# Patient Record
Sex: Female | Born: 2000 | Race: White | Hispanic: No | Smoking: Never smoker
Health system: Southern US, Community
[De-identification: ages and names within clinical notes are randomized; demographics above are authoritative.]

## PROBLEM LIST (undated history)

## (undated) DIAGNOSIS — E7800 Pure hypercholesterolemia, unspecified: Secondary | ICD-10-CM

## (undated) DIAGNOSIS — N83209 Unspecified ovarian cyst, unspecified side: Secondary | ICD-10-CM

## (undated) DIAGNOSIS — F909 Attention-deficit hyperactivity disorder, unspecified type: Secondary | ICD-10-CM

## (undated) DIAGNOSIS — F32A Depression, unspecified: Secondary | ICD-10-CM

## (undated) DIAGNOSIS — K5900 Constipation, unspecified: Secondary | ICD-10-CM

## (undated) DIAGNOSIS — F329 Major depressive disorder, single episode, unspecified: Secondary | ICD-10-CM

## (undated) DIAGNOSIS — R519 Headache, unspecified: Secondary | ICD-10-CM

## (undated) HISTORY — DX: Headache, unspecified: R51.9

## (undated) HISTORY — PX: TONSILECTOMY, ADENOIDECTOMY, BILATERAL MYRINGOTOMY AND TUBES: SHX2538

## (undated) HISTORY — PX: TONSILLECTOMY AND ADENOIDECTOMY: SUR1326

## (undated) HISTORY — PX: WISDOM TOOTH EXTRACTION: SHX21

## (undated) HISTORY — DX: Attention-deficit hyperactivity disorder, unspecified type: F90.9

## (undated) HISTORY — DX: Unspecified ovarian cyst, unspecified side: N83.209

## (undated) HISTORY — DX: Pure hypercholesterolemia, unspecified: E78.00

---
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
## 2019-02-17 ENCOUNTER — Other Ambulatory Visit: Payer: Self-pay

## 2019-02-17 DIAGNOSIS — R6889 Other general symptoms and signs: Secondary | ICD-10-CM | POA: Diagnosis not present

## 2019-02-17 DIAGNOSIS — Z20822 Contact with and (suspected) exposure to covid-19: Secondary | ICD-10-CM

## 2019-02-18 LAB — NOVEL CORONAVIRUS, NAA: SARS-CoV-2, NAA: NOT DETECTED

## 2019-08-10 ENCOUNTER — Telehealth: Payer: Self-pay | Admitting: Advanced Practice Midwife

## 2019-08-10 NOTE — Telephone Encounter (Signed)
Tried to reach the patient to remind her of her appointment/restrictions, call can not be completed at this time. °

## 2019-08-11 ENCOUNTER — Ambulatory Visit: Payer: Medicaid Other | Admitting: Advanced Practice Midwife

## 2019-09-28 ENCOUNTER — Telehealth: Payer: Self-pay | Admitting: Advanced Practice Midwife

## 2019-09-28 NOTE — Telephone Encounter (Signed)
Tried to reach the patient to remind her of her appointment/restrictions, mailbox is not set up. 

## 2019-10-03 ENCOUNTER — Ambulatory Visit: Payer: Medicaid Other | Admitting: Advanced Practice Midwife

## 2019-11-12 DIAGNOSIS — Z23 Encounter for immunization: Secondary | ICD-10-CM | POA: Diagnosis not present

## 2019-11-29 DIAGNOSIS — R1933 Right lower quadrant abdominal rigidity: Secondary | ICD-10-CM | POA: Diagnosis not present

## 2019-12-05 DIAGNOSIS — R1013 Epigastric pain: Secondary | ICD-10-CM | POA: Diagnosis not present

## 2019-12-05 DIAGNOSIS — R112 Nausea with vomiting, unspecified: Secondary | ICD-10-CM | POA: Diagnosis not present

## 2019-12-05 DIAGNOSIS — R197 Diarrhea, unspecified: Secondary | ICD-10-CM | POA: Diagnosis not present

## 2019-12-05 DIAGNOSIS — R109 Unspecified abdominal pain: Secondary | ICD-10-CM | POA: Diagnosis not present

## 2019-12-05 DIAGNOSIS — R1031 Right lower quadrant pain: Secondary | ICD-10-CM | POA: Diagnosis not present

## 2019-12-05 DIAGNOSIS — R1011 Right upper quadrant pain: Secondary | ICD-10-CM | POA: Diagnosis not present

## 2019-12-12 DIAGNOSIS — Z23 Encounter for immunization: Secondary | ICD-10-CM | POA: Diagnosis not present

## 2020-02-09 DIAGNOSIS — N946 Dysmenorrhea, unspecified: Secondary | ICD-10-CM | POA: Diagnosis not present

## 2020-02-09 DIAGNOSIS — F3341 Major depressive disorder, recurrent, in partial remission: Secondary | ICD-10-CM | POA: Diagnosis not present

## 2020-04-02 DIAGNOSIS — Z6823 Body mass index (BMI) 23.0-23.9, adult: Secondary | ICD-10-CM | POA: Diagnosis not present

## 2020-04-02 DIAGNOSIS — Z0000 Encounter for general adult medical examination without abnormal findings: Secondary | ICD-10-CM | POA: Diagnosis not present

## 2020-07-03 DIAGNOSIS — N3091 Cystitis, unspecified with hematuria: Secondary | ICD-10-CM | POA: Diagnosis not present

## 2020-07-03 DIAGNOSIS — F3341 Major depressive disorder, recurrent, in partial remission: Secondary | ICD-10-CM | POA: Diagnosis not present

## 2020-07-03 DIAGNOSIS — Z6824 Body mass index (BMI) 24.0-24.9, adult: Secondary | ICD-10-CM | POA: Diagnosis not present

## 2020-07-08 DIAGNOSIS — H5213 Myopia, bilateral: Secondary | ICD-10-CM | POA: Diagnosis not present

## 2020-09-19 DIAGNOSIS — Z6826 Body mass index (BMI) 26.0-26.9, adult: Secondary | ICD-10-CM | POA: Diagnosis not present

## 2020-09-19 DIAGNOSIS — N946 Dysmenorrhea, unspecified: Secondary | ICD-10-CM | POA: Diagnosis not present

## 2020-09-19 DIAGNOSIS — F3341 Major depressive disorder, recurrent, in partial remission: Secondary | ICD-10-CM | POA: Diagnosis not present

## 2020-09-24 IMAGING — CT CT ABD-PELV W/ CM
2 of 4 series · 16 of 46 positions shown, 18 images · IV contrast (Isovue)
Comparison: CT abdomen and pelvis 09/14/2015.

CLINICAL DATA: Right flank pain and decreased appetite. Mildly
elevated white blood cell count.

EXAM:
CT ABDOMEN AND PELVIS WITH CONTRAST
TECHNIQUE: Multidetector CT imaging of the abdomen and pelvis was performed
using the standard protocol following bolus administration of
intravenous contrast.
CONTRAST:  80 mL 7GJDMM-JTT IOPAMIDOL (7GJDMM-JTT) INJECTION 61%

[Series 2: axial st · axial · 0.61mm/px · z∈[+1033,+1413]mm · 13 of 84 slices shown, 15 images]
[im 4/84  soft-tissue]
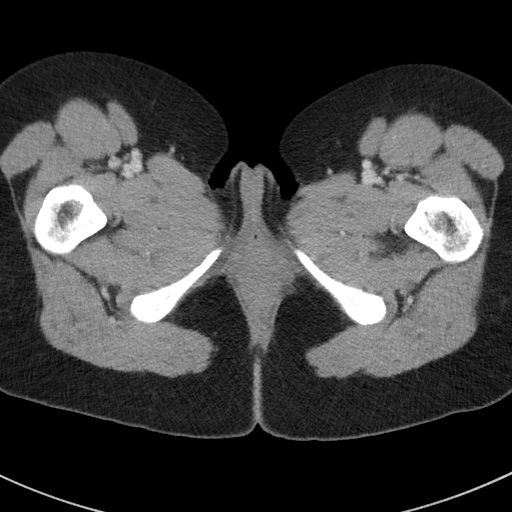
[im 4/84  bone]
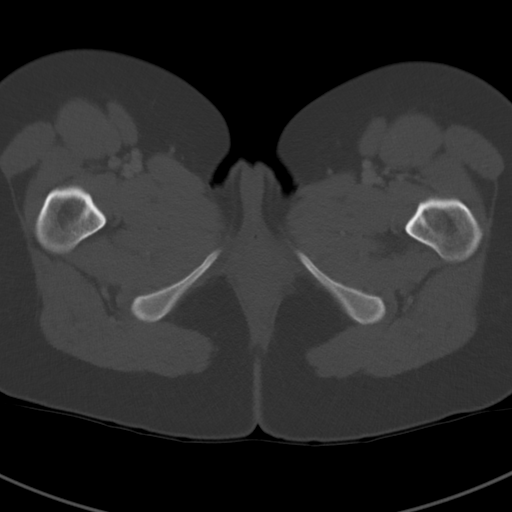
[im 10/84  soft-tissue]
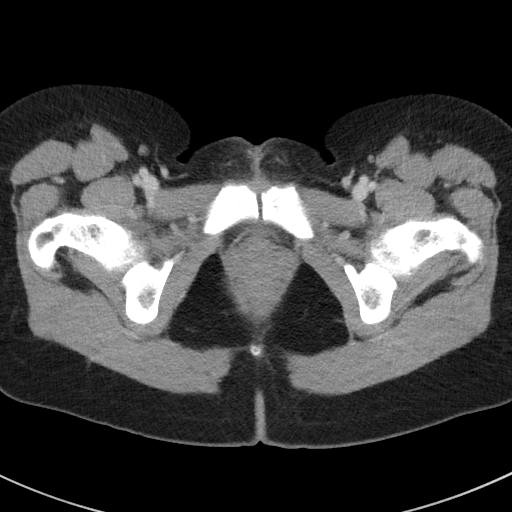
[im 17/84  soft-tissue]
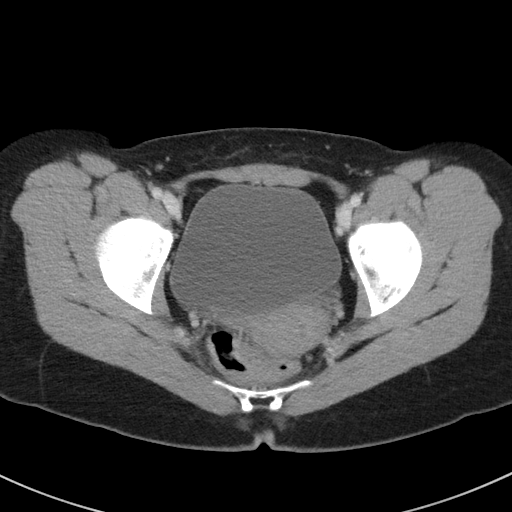
[im 24/84  soft-tissue]
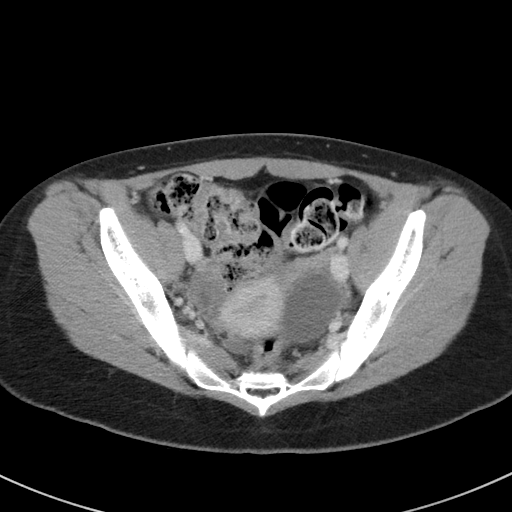
[im 30/84  soft-tissue]
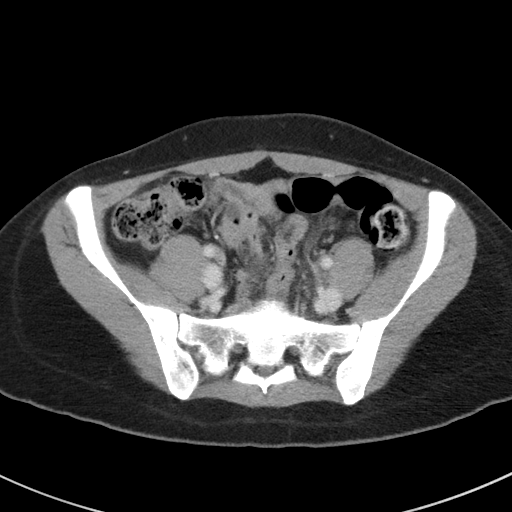
[im 37/84  soft-tissue]
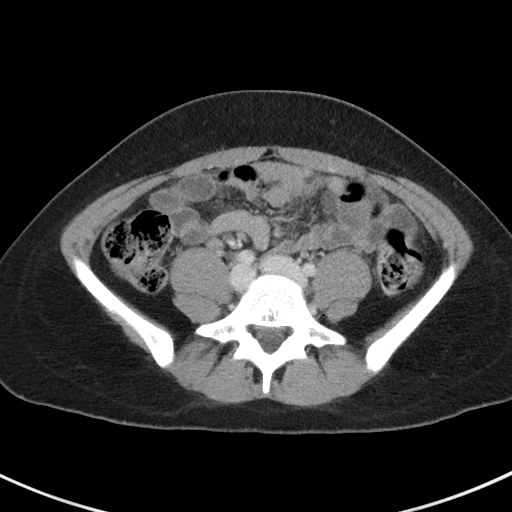
[im 44/84  soft-tissue]
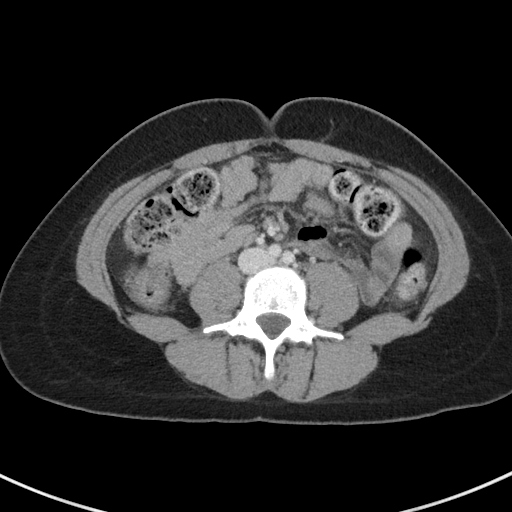
[im 47/84  soft-tissue]
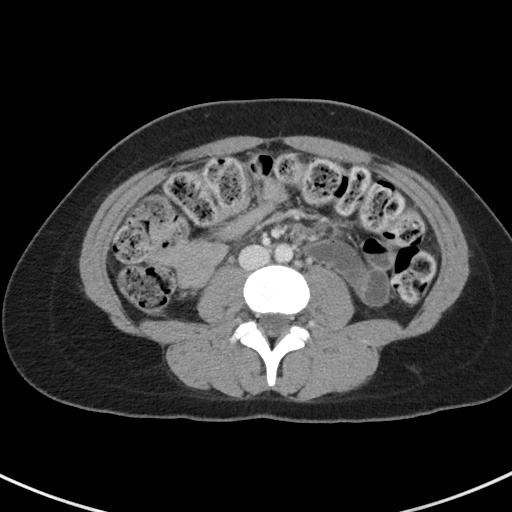
[im 54/84  soft-tissue]
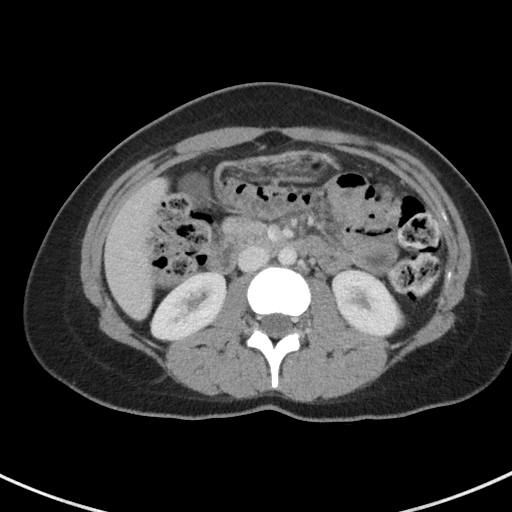
[im 54/84  bone]
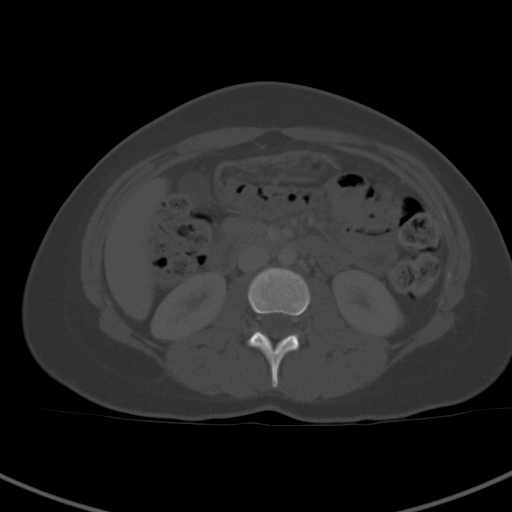
[im 60/84  soft-tissue]
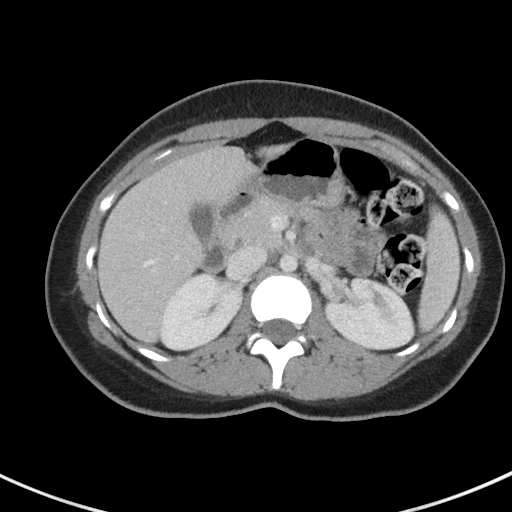
[im 67/84  soft-tissue]
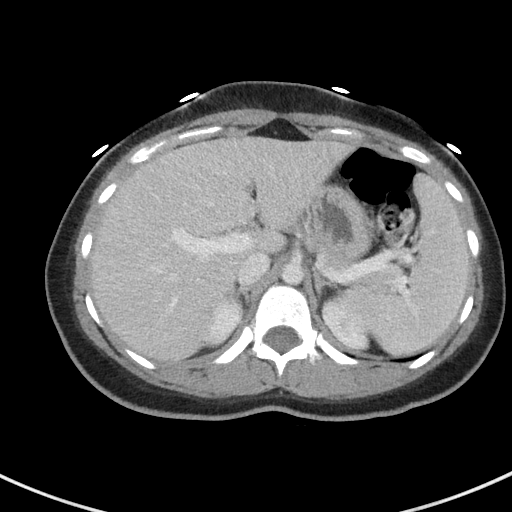
[im 74/84  soft-tissue]
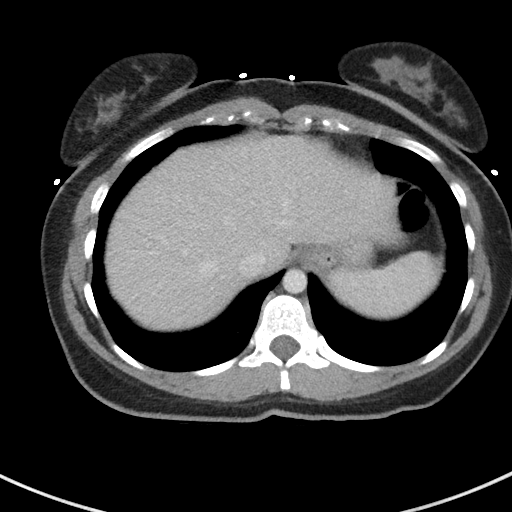
[im 80/84  soft-tissue]
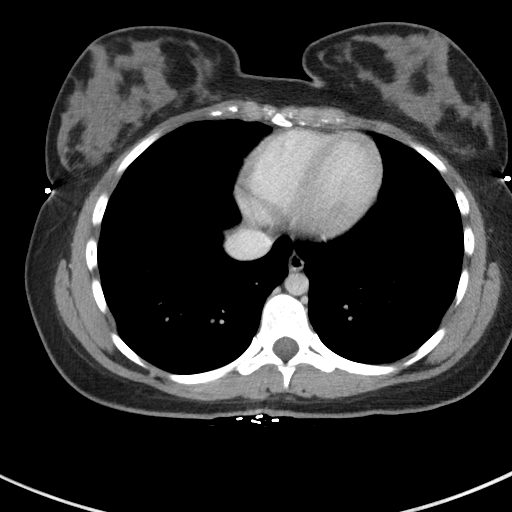

[Series 5: coronal st · coronal · 0.73mm/px · 3 of 72 slices shown]
[im 24/72  soft-tissue]
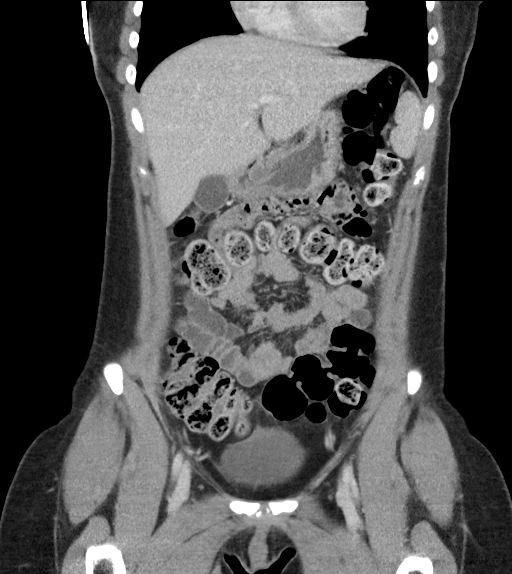
[im 32/72  soft-tissue]
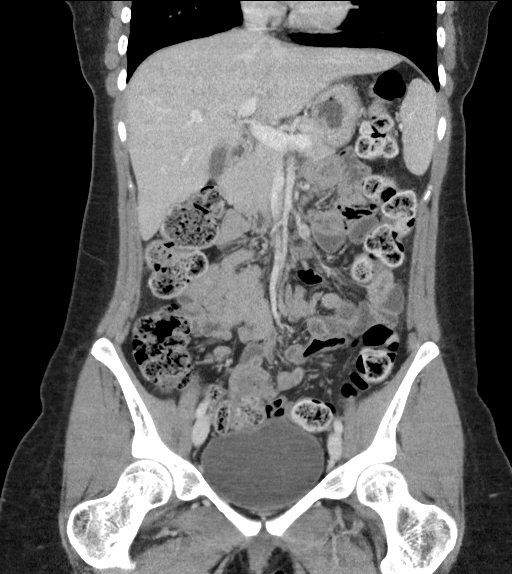
[im 40/72  soft-tissue]
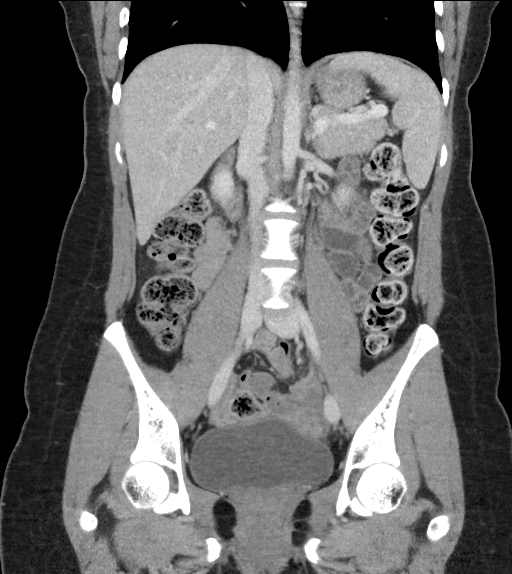

[16 of 46 positions shown; findings below may reference images not displayed]

FINDINGS: Lower chest: Lung bases clear.  No pleural or pericardial effusion.

Hepatobiliary: No focal liver abnormality is seen. No gallstones,
gallbladder wall thickening, or biliary dilatation.

Pancreas: Unremarkable. No pancreatic ductal dilatation or
surrounding inflammatory changes.

Spleen: Normal in size without focal abnormality.

Adrenals/Urinary Tract: Adrenal glands are unremarkable. Kidneys are
normal, without renal calculi, focal lesion, or hydronephrosis.
Bladder is unremarkable.

Stomach/Bowel: Stomach is within normal limits. Appendix appears
normal. No evidence of bowel wall thickening, distention, or
inflammatory changes.

Vascular/Lymphatic: No significant vascular findings are present. No
enlarged abdominal or pelvic lymph nodes.

Reproductive: Left ovarian cyst measuring 4.3 x 3.0 x 3.8 cm is
identified. Uterus and right ovary appear normal.

Other: None.

Musculoskeletal: Negative.
IMPRESSION: No acute abnormality abdomen or pelvis. No finding to explain the
patient's symptoms.

4.3 cm left ovarian cyst is probably benign. Follow-up ultrasound
6-12 weeks is recommended to ensure resolution. This recommendation
follows ACR consensus guidelines: White Paper of the ACR Incidental
Findings Committee II on Adnexal Findings. [HOSPITAL]

## 2020-10-06 NOTE — Progress Notes (Deleted)
   GYN VISIT Patient name: Jasmine Vega MRN [id number]  Date of birth: Jun 16, 2001 Chief Complaint:   No chief complaint on file.  History of Present Illness:   Jasmine Vega is a 20 y.o. G80P0000 *** female being seen today for ***.     -Records reviewed seen 09/19/20- reported AUB while on OCPs -Previously seen for ovarian cyst- resolved.  Previously on megace 40mg  daily- last seen 08/2018.    No LMP recorded.  Depression screen PHQ 2/9 08/17/2018  Decreased Interest 1  Down, Depressed, Hopeless 2  PHQ - 2 Score 3  Altered sleeping 1  Tired, decreased energy 1  Change in appetite 1  Feeling bad or failure about yourself  1  Trouble concentrating 1  Moving slowly or fidgety/restless 0  Suicidal thoughts 0  PHQ-9 Score 8  Difficult doing work/chores Very difficult     Review of Systems:   Pertinent items are noted in HPI Denies fever/chills, dizziness, headaches, visual disturbances, fatigue, shortness of breath, chest pain, abdominal pain, vomiting, *** problems with periods, bowel movements, urination, or intercourse unless otherwise stated above.  Pertinent History Reviewed:  Reviewed past medical,surgical, social, obstetrical and family history.  Reviewed problem list, medications and allergies. Physical Assessment:  There were no vitals filed for this visit.There is no height or weight on file to calculate BMI.       Physical Examination:   General appearance: alert, well appearing, and in no distress  Psych: mood appropriate, normal affect  Skin: warm & dry   Cardiovascular: normal heart rate noted  Respiratory: normal respiratory effort, no distress  Abdomen: soft, non-tender   Pelvic: {pelvic exam:315900::"normal external genitalia, vulva, vagina, cervix, uterus and adnexa"}  Extremities: no edema   Chaperone: {Chaperone:19197::"N/A","Latisha Cresenzo","Janet Young","Amanda Andrews","Peggy Dones","Nicole Jones","Angel Neas"}    Assessment & Plan:  1) ***>  ***  2) ***> ***  No orders of the defined types were placed in this encounter.   No follow-ups on file.   08/19/2018, DO Attending Obstetrician & Gynecologist, [facility] for [facility]

## 2020-10-09 ENCOUNTER — Ambulatory Visit: Payer: Medicaid Other | Admitting: Obstetrics & Gynecology

## 2021-01-08 DIAGNOSIS — L03115 Cellulitis of right lower limb: Secondary | ICD-10-CM | POA: Diagnosis not present

## 2021-01-09 ENCOUNTER — Telehealth: Payer: Self-pay

## 2021-01-09 NOTE — Telephone Encounter (Signed)
Not a patient of [facility], not seen at [facility] facility.

## 2021-01-10 DIAGNOSIS — L03115 Cellulitis of right lower limb: Secondary | ICD-10-CM | POA: Diagnosis not present

## 2021-05-06 DIAGNOSIS — F3341 Major depressive disorder, recurrent, in partial remission: Secondary | ICD-10-CM | POA: Diagnosis not present

## 2021-05-06 DIAGNOSIS — Z6827 Body mass index (BMI) 27.0-27.9, adult: Secondary | ICD-10-CM | POA: Diagnosis not present

## 2021-05-06 DIAGNOSIS — N946 Dysmenorrhea, unspecified: Secondary | ICD-10-CM | POA: Diagnosis not present

## 2021-09-03 DIAGNOSIS — Z0000 Encounter for general adult medical examination without abnormal findings: Secondary | ICD-10-CM | POA: Diagnosis not present

## 2021-09-03 DIAGNOSIS — Z6829 Body mass index (BMI) 29.0-29.9, adult: Secondary | ICD-10-CM | POA: Diagnosis not present

## 2022-06-17 DIAGNOSIS — I10 Essential (primary) hypertension: Secondary | ICD-10-CM | POA: Diagnosis not present

## 2022-06-17 DIAGNOSIS — R1032 Left lower quadrant pain: Secondary | ICD-10-CM | POA: Diagnosis not present

## 2022-06-17 DIAGNOSIS — R109 Unspecified abdominal pain: Secondary | ICD-10-CM | POA: Diagnosis not present

## 2022-06-17 DIAGNOSIS — N390 Urinary tract infection, site not specified: Secondary | ICD-10-CM | POA: Diagnosis not present

## 2022-06-17 DIAGNOSIS — R1111 Vomiting without nausea: Secondary | ICD-10-CM | POA: Diagnosis not present

## 2022-06-19 DIAGNOSIS — Z6829 Body mass index (BMI) 29.0-29.9, adult: Secondary | ICD-10-CM | POA: Diagnosis not present

## 2022-06-19 DIAGNOSIS — F330 Major depressive disorder, recurrent, mild: Secondary | ICD-10-CM | POA: Diagnosis not present

## 2022-06-19 DIAGNOSIS — E669 Obesity, unspecified: Secondary | ICD-10-CM | POA: Diagnosis not present

## 2022-06-19 DIAGNOSIS — N3020 Other chronic cystitis without hematuria: Secondary | ICD-10-CM | POA: Diagnosis not present

## 2022-06-19 DIAGNOSIS — H8110 Benign paroxysmal vertigo, unspecified ear: Secondary | ICD-10-CM | POA: Diagnosis not present

## 2022-07-02 DIAGNOSIS — K219 Gastro-esophageal reflux disease without esophagitis: Secondary | ICD-10-CM | POA: Diagnosis not present

## 2022-07-02 DIAGNOSIS — Z20822 Contact with and (suspected) exposure to covid-19: Secondary | ICD-10-CM | POA: Diagnosis not present

## 2022-07-02 DIAGNOSIS — F339 Major depressive disorder, recurrent, unspecified: Secondary | ICD-10-CM | POA: Diagnosis not present

## 2022-07-02 DIAGNOSIS — Z6379 Other stressful life events affecting family and household: Secondary | ICD-10-CM | POA: Diagnosis not present

## 2022-07-02 DIAGNOSIS — F331 Major depressive disorder, recurrent, moderate: Secondary | ICD-10-CM | POA: Diagnosis not present

## 2022-07-02 DIAGNOSIS — Z91148 Patient's other noncompliance with medication regimen for other reason: Secondary | ICD-10-CM | POA: Diagnosis not present

## 2022-07-02 DIAGNOSIS — R079 Chest pain, unspecified: Secondary | ICD-10-CM | POA: Diagnosis not present

## 2022-07-02 DIAGNOSIS — T391X2A Poisoning by 4-Aminophenol derivatives, intentional self-harm, initial encounter: Secondary | ICD-10-CM | POA: Diagnosis not present

## 2022-07-02 DIAGNOSIS — N83209 Unspecified ovarian cyst, unspecified side: Secondary | ICD-10-CM | POA: Diagnosis not present

## 2022-07-02 DIAGNOSIS — R45851 Suicidal ideations: Secondary | ICD-10-CM | POA: Diagnosis not present

## 2022-07-07 DIAGNOSIS — F331 Major depressive disorder, recurrent, moderate: Secondary | ICD-10-CM | POA: Diagnosis not present

## 2022-07-07 DIAGNOSIS — Z6379 Other stressful life events affecting family and household: Secondary | ICD-10-CM | POA: Diagnosis not present

## 2022-07-07 DIAGNOSIS — F339 Major depressive disorder, recurrent, unspecified: Secondary | ICD-10-CM | POA: Diagnosis not present

## 2022-07-07 DIAGNOSIS — K219 Gastro-esophageal reflux disease without esophagitis: Secondary | ICD-10-CM | POA: Diagnosis not present

## 2022-07-07 DIAGNOSIS — Z91148 Patient's other noncompliance with medication regimen for other reason: Secondary | ICD-10-CM | POA: Diagnosis not present

## 2022-07-07 DIAGNOSIS — N83209 Unspecified ovarian cyst, unspecified side: Secondary | ICD-10-CM | POA: Diagnosis not present

## 2022-07-07 DIAGNOSIS — R45851 Suicidal ideations: Secondary | ICD-10-CM | POA: Diagnosis not present

## 2022-07-09 DIAGNOSIS — K219 Gastro-esophageal reflux disease without esophagitis: Secondary | ICD-10-CM | POA: Diagnosis not present

## 2022-07-09 DIAGNOSIS — N83209 Unspecified ovarian cyst, unspecified side: Secondary | ICD-10-CM | POA: Diagnosis not present

## 2022-07-09 DIAGNOSIS — Z6379 Other stressful life events affecting family and household: Secondary | ICD-10-CM | POA: Diagnosis not present

## 2022-07-09 DIAGNOSIS — F331 Major depressive disorder, recurrent, moderate: Secondary | ICD-10-CM | POA: Diagnosis not present

## 2022-07-09 DIAGNOSIS — F339 Major depressive disorder, recurrent, unspecified: Secondary | ICD-10-CM | POA: Diagnosis not present

## 2022-07-09 DIAGNOSIS — Z91148 Patient's other noncompliance with medication regimen for other reason: Secondary | ICD-10-CM | POA: Diagnosis not present

## 2022-07-09 DIAGNOSIS — R45851 Suicidal ideations: Secondary | ICD-10-CM | POA: Diagnosis not present

## 2022-07-10 DIAGNOSIS — F331 Major depressive disorder, recurrent, moderate: Secondary | ICD-10-CM | POA: Diagnosis not present

## 2022-07-10 DIAGNOSIS — F4310 Post-traumatic stress disorder, unspecified: Secondary | ICD-10-CM | POA: Diagnosis not present

## 2022-07-25 DIAGNOSIS — F331 Major depressive disorder, recurrent, moderate: Secondary | ICD-10-CM | POA: Diagnosis not present

## 2022-07-25 DIAGNOSIS — F4310 Post-traumatic stress disorder, unspecified: Secondary | ICD-10-CM | POA: Diagnosis not present

## 2022-08-08 DIAGNOSIS — F4310 Post-traumatic stress disorder, unspecified: Secondary | ICD-10-CM | POA: Diagnosis not present

## 2022-08-08 DIAGNOSIS — F331 Major depressive disorder, recurrent, moderate: Secondary | ICD-10-CM | POA: Diagnosis not present

## 2022-08-22 DIAGNOSIS — F4310 Post-traumatic stress disorder, unspecified: Secondary | ICD-10-CM | POA: Diagnosis not present

## 2022-08-29 DIAGNOSIS — F4310 Post-traumatic stress disorder, unspecified: Secondary | ICD-10-CM | POA: Diagnosis not present

## 2022-09-05 DIAGNOSIS — F4312 Post-traumatic stress disorder, chronic: Secondary | ICD-10-CM | POA: Diagnosis not present

## 2022-09-05 DIAGNOSIS — F331 Major depressive disorder, recurrent, moderate: Secondary | ICD-10-CM | POA: Diagnosis not present

## 2022-09-05 DIAGNOSIS — F4310 Post-traumatic stress disorder, unspecified: Secondary | ICD-10-CM | POA: Diagnosis not present

## 2022-09-19 DIAGNOSIS — F4310 Post-traumatic stress disorder, unspecified: Secondary | ICD-10-CM | POA: Diagnosis not present

## 2022-09-26 DIAGNOSIS — F4310 Post-traumatic stress disorder, unspecified: Secondary | ICD-10-CM | POA: Diagnosis not present

## 2022-10-02 DIAGNOSIS — F331 Major depressive disorder, recurrent, moderate: Secondary | ICD-10-CM | POA: Diagnosis not present

## 2022-10-02 DIAGNOSIS — F4312 Post-traumatic stress disorder, chronic: Secondary | ICD-10-CM | POA: Diagnosis not present

## 2022-10-10 DIAGNOSIS — F4310 Post-traumatic stress disorder, unspecified: Secondary | ICD-10-CM | POA: Diagnosis not present

## 2022-10-13 ENCOUNTER — Telehealth: Payer: Self-pay

## 2022-10-13 NOTE — Telephone Encounter (Signed)
Reached out to schedule patient apt with PCP. No working #. AS, CMA

## 2022-10-17 DIAGNOSIS — F4310 Post-traumatic stress disorder, unspecified: Secondary | ICD-10-CM | POA: Diagnosis not present

## 2022-10-23 ENCOUNTER — Encounter: Payer: Self-pay | Admitting: Advanced Practice Midwife

## 2022-10-23 ENCOUNTER — Ambulatory Visit (INDEPENDENT_AMBULATORY_CARE_PROVIDER_SITE_OTHER): Payer: Medicaid Other | Admitting: Advanced Practice Midwife

## 2022-10-23 ENCOUNTER — Other Ambulatory Visit (HOSPITAL_COMMUNITY)
Admission: RE | Admit: 2022-10-23 | Discharge: 2022-10-23 | Disposition: A | Discharge status: Home / Self Care | Payer: Medicaid Other | Source: Ambulatory Visit | Attending: Advanced Practice Midwife | Admitting: Advanced Practice Midwife

## 2022-10-23 VITALS — BP 120/82 | HR 56 | Ht 61.0 in | Wt 155.0 lb

## 2022-10-23 DIAGNOSIS — N914 Secondary oligomenorrhea: Secondary | ICD-10-CM

## 2022-10-23 DIAGNOSIS — N946 Dysmenorrhea, unspecified: Secondary | ICD-10-CM | POA: Diagnosis not present

## 2022-10-23 DIAGNOSIS — Z124 Encounter for screening for malignant neoplasm of cervix: Secondary | ICD-10-CM | POA: Insufficient documentation

## 2022-10-23 MED ORDER — ETONOGESTREL-ETHINYL ESTRADIOL 0.12-0.015 MG/24HR VA RING: VAGINAL_RING | VAGINAL | 12 refills | Status: DC

## 2022-10-23 NOTE — Progress Notes (Signed)
[facility] Visit  Patient name: Jasmine Vega MRN [id number]  Date of birth: 08/31/2000  CC & HPI:  Jasmine Vega is a 22 y.o.  female presenting today for period management and first pap. Had guardisils  Menarche age 56. Would have periods about once a month, would occ skip a period. Took a COC July 2022, bled for about 8 weeks, then didn't really bleed after that.  Stopped COCs Jan 2023. Also noticed she would have a stomach pain for about 3 hours after taking each COC.  Periods were normal for a few months, then stopped around July/aug.  Had a period March and also April of this year, but painful, heavy, made her nauseated. Also, has had a frontal HA for about 2 weeks, after stopping period, but could be eyes/tension.  Boyfriend lives out of town, so rarely sexually active.    Pertinent History Reviewed:  Medical & Surgical Hx:   Past Medical History:  Diagnosis Date   Constipation    Depression    Frequent headaches    Ovarian cyst    Past Surgical History:  Procedure Laterality Date   TONSILECTOMY, ADENOIDECTOMY, BILATERAL MYRINGOTOMY AND TUBES     TONSILLECTOMY AND ADENOIDECTOMY     WISDOM TOOTH EXTRACTION     Family History  Problem Relation Age of Onset   Cancer Maternal Grandmother    Hernia Maternal Grandmother    Parkinson's disease Maternal Grandfather    Heart disease Father    Bipolar disorder Mother    Heart disease Brother    Asperger's syndrome Brother     Current Outpatient Medications:    buPROPion (WELLBUTRIN XL) 150 MG 24 hr tablet, Take 150 mg by mouth every morning., Disp: , Rfl:    etonogestrel-ethinyl estradiol (NUVARING) 0.12-0.015 MG/24HR vaginal ring, Insert vaginally on the first of every month, remove on the 25th, Disp: 1 each, Rfl: 12   FLUoxetine (PROZAC) 40 MG capsule, Take 40 mg by mouth daily., Disp: , Rfl:    hydrOXYzine (VISTARIL) 25 MG capsule, SMARTSIG:1 Capsule(s) By Mouth Every Evening, Disp: , Rfl:    meclizine  (ANTIVERT) 12.5 MG tablet, Take 12.5 mg by mouth 3 (three) times daily as needed., Disp: , Rfl:  Social History: Reviewed -  reports that she has never smoked. She has never used smokeless tobacco.  Review of Systems:   Constitutional: Negative for fever and chills Eyes: Negative for visual disturbances Respiratory: Negative for shortness of breath, dyspnea Cardiovascular: Negative for chest pain or palpitations  Gastrointestinal: Negative for vomiting, diarrhea and constipation; no abdominal pain Genitourinary: Negative for dysuria and urgency, vaginal irritation or itching Musculoskeletal: Negative for back pain, joint pain, myalgias  Neurological: Negative for dizziness and headaches    Objective Findings:    Physical Examination: Vitals:   10/23/22 1356  BP: 120/82  Pulse: (!) 56   General appearance - well appearing, and in no distress Mental status - alert, oriented to person, place, and time Chest:  Normal respiratory effort Heart - normal rate and regular rhythm Abdomen:  Soft, nontender Pelvic: Normal appearing vagina, DC w/o odor.  Pap collected.  Musculoskeletal:  Normal range of motion without pain Extremities:  No edema    No results found for this or any previous visit (from the past 24 hour(s)).    Assessment & Plan:  A:   Dysmenorrhea, oligomenorrhea, screening for cervical cancer P:  Nuva Ring:  insert on day 1 of next period, out on the 25th.  Plan in on 1st, out on 25th    Return in about 3 months (around 01/22/2023) for med check (could be video if pt wants).  Jacklyn Shell CNM 10/23/2022 2:30 PM

## 2022-10-27 LAB — CYTOLOGY - PAP
Adequacy: ABSENT
Chlamydia: NEGATIVE
Comment: NEGATIVE
Comment: NEGATIVE
Comment: NORMAL
Diagnosis: NEGATIVE
Neisseria Gonorrhea: NEGATIVE
Trichomonas: NEGATIVE

## 2022-10-31 DIAGNOSIS — F331 Major depressive disorder, recurrent, moderate: Secondary | ICD-10-CM | POA: Diagnosis not present

## 2022-10-31 DIAGNOSIS — F4312 Post-traumatic stress disorder, chronic: Secondary | ICD-10-CM | POA: Diagnosis not present

## 2022-11-07 DIAGNOSIS — F4310 Post-traumatic stress disorder, unspecified: Secondary | ICD-10-CM | POA: Diagnosis not present

## 2022-11-14 DIAGNOSIS — F4310 Post-traumatic stress disorder, unspecified: Secondary | ICD-10-CM | POA: Diagnosis not present

## 2022-11-28 DIAGNOSIS — F4310 Post-traumatic stress disorder, unspecified: Secondary | ICD-10-CM | POA: Diagnosis not present

## 2022-12-12 DIAGNOSIS — F4310 Post-traumatic stress disorder, unspecified: Secondary | ICD-10-CM | POA: Diagnosis not present

## 2022-12-15 DIAGNOSIS — F331 Major depressive disorder, recurrent, moderate: Secondary | ICD-10-CM | POA: Diagnosis not present

## 2022-12-15 DIAGNOSIS — F4312 Post-traumatic stress disorder, chronic: Secondary | ICD-10-CM | POA: Diagnosis not present

## 2022-12-26 DIAGNOSIS — F4310 Post-traumatic stress disorder, unspecified: Secondary | ICD-10-CM | POA: Diagnosis not present

## 2023-01-09 DIAGNOSIS — F4310 Post-traumatic stress disorder, unspecified: Secondary | ICD-10-CM | POA: Diagnosis not present

## 2023-01-14 DIAGNOSIS — F331 Major depressive disorder, recurrent, moderate: Secondary | ICD-10-CM | POA: Diagnosis not present

## 2023-01-14 DIAGNOSIS — F4312 Post-traumatic stress disorder, chronic: Secondary | ICD-10-CM | POA: Diagnosis not present

## 2023-01-23 DIAGNOSIS — F4310 Post-traumatic stress disorder, unspecified: Secondary | ICD-10-CM | POA: Diagnosis not present

## 2023-02-06 DIAGNOSIS — F4310 Post-traumatic stress disorder, unspecified: Secondary | ICD-10-CM | POA: Diagnosis not present

## 2023-02-11 DIAGNOSIS — F4312 Post-traumatic stress disorder, chronic: Secondary | ICD-10-CM | POA: Diagnosis not present

## 2023-02-11 DIAGNOSIS — F331 Major depressive disorder, recurrent, moderate: Secondary | ICD-10-CM | POA: Diagnosis not present

## 2023-02-23 DIAGNOSIS — F900 Attention-deficit hyperactivity disorder, predominantly inattentive type: Secondary | ICD-10-CM | POA: Diagnosis not present

## 2023-02-23 DIAGNOSIS — F4312 Post-traumatic stress disorder, chronic: Secondary | ICD-10-CM | POA: Diagnosis not present

## 2023-02-23 DIAGNOSIS — F331 Major depressive disorder, recurrent, moderate: Secondary | ICD-10-CM | POA: Diagnosis not present

## 2023-03-10 DIAGNOSIS — F4312 Post-traumatic stress disorder, chronic: Secondary | ICD-10-CM | POA: Diagnosis not present

## 2023-03-10 DIAGNOSIS — F331 Major depressive disorder, recurrent, moderate: Secondary | ICD-10-CM | POA: Diagnosis not present

## 2023-03-13 DIAGNOSIS — F4310 Post-traumatic stress disorder, unspecified: Secondary | ICD-10-CM | POA: Diagnosis not present

## 2023-03-18 DIAGNOSIS — F331 Major depressive disorder, recurrent, moderate: Secondary | ICD-10-CM | POA: Diagnosis not present

## 2023-03-18 DIAGNOSIS — F4312 Post-traumatic stress disorder, chronic: Secondary | ICD-10-CM | POA: Diagnosis not present

## 2023-03-18 DIAGNOSIS — F900 Attention-deficit hyperactivity disorder, predominantly inattentive type: Secondary | ICD-10-CM | POA: Diagnosis not present

## 2023-03-24 DIAGNOSIS — F331 Major depressive disorder, recurrent, moderate: Secondary | ICD-10-CM | POA: Diagnosis not present

## 2023-03-24 DIAGNOSIS — F4312 Post-traumatic stress disorder, chronic: Secondary | ICD-10-CM | POA: Diagnosis not present

## 2023-03-24 DIAGNOSIS — F900 Attention-deficit hyperactivity disorder, predominantly inattentive type: Secondary | ICD-10-CM | POA: Diagnosis not present

## 2023-04-01 DIAGNOSIS — F4310 Post-traumatic stress disorder, unspecified: Secondary | ICD-10-CM | POA: Diagnosis not present

## 2023-04-24 DIAGNOSIS — F4310 Post-traumatic stress disorder, unspecified: Secondary | ICD-10-CM | POA: Diagnosis not present

## 2023-05-20 DIAGNOSIS — F331 Major depressive disorder, recurrent, moderate: Secondary | ICD-10-CM | POA: Diagnosis not present

## 2023-05-20 DIAGNOSIS — F900 Attention-deficit hyperactivity disorder, predominantly inattentive type: Secondary | ICD-10-CM | POA: Diagnosis not present

## 2023-05-20 DIAGNOSIS — F4312 Post-traumatic stress disorder, chronic: Secondary | ICD-10-CM | POA: Diagnosis not present

## 2023-05-21 DIAGNOSIS — R112 Nausea with vomiting, unspecified: Secondary | ICD-10-CM | POA: Diagnosis not present

## 2023-05-25 ENCOUNTER — Ambulatory Visit: Payer: Medicaid Other | Admitting: Advanced Practice Midwife

## 2023-06-03 DIAGNOSIS — F900 Attention-deficit hyperactivity disorder, predominantly inattentive type: Secondary | ICD-10-CM | POA: Diagnosis not present

## 2023-06-03 DIAGNOSIS — F331 Major depressive disorder, recurrent, moderate: Secondary | ICD-10-CM | POA: Diagnosis not present

## 2023-06-03 DIAGNOSIS — F4312 Post-traumatic stress disorder, chronic: Secondary | ICD-10-CM | POA: Diagnosis not present

## 2023-06-05 DIAGNOSIS — F4310 Post-traumatic stress disorder, unspecified: Secondary | ICD-10-CM | POA: Diagnosis not present

## 2023-06-26 DIAGNOSIS — F4310 Post-traumatic stress disorder, unspecified: Secondary | ICD-10-CM | POA: Diagnosis not present

## 2023-07-06 DIAGNOSIS — F4312 Post-traumatic stress disorder, chronic: Secondary | ICD-10-CM | POA: Diagnosis not present

## 2023-07-06 DIAGNOSIS — F900 Attention-deficit hyperactivity disorder, predominantly inattentive type: Secondary | ICD-10-CM | POA: Diagnosis not present

## 2023-07-06 DIAGNOSIS — F331 Major depressive disorder, recurrent, moderate: Secondary | ICD-10-CM | POA: Diagnosis not present

## 2023-07-24 DIAGNOSIS — F4310 Post-traumatic stress disorder, unspecified: Secondary | ICD-10-CM | POA: Diagnosis not present

## 2023-08-03 DIAGNOSIS — F331 Major depressive disorder, recurrent, moderate: Secondary | ICD-10-CM | POA: Diagnosis not present

## 2023-08-03 DIAGNOSIS — F4312 Post-traumatic stress disorder, chronic: Secondary | ICD-10-CM | POA: Diagnosis not present

## 2023-08-03 DIAGNOSIS — F900 Attention-deficit hyperactivity disorder, predominantly inattentive type: Secondary | ICD-10-CM | POA: Diagnosis not present

## 2023-08-14 DIAGNOSIS — F4310 Post-traumatic stress disorder, unspecified: Secondary | ICD-10-CM | POA: Diagnosis not present

## 2023-08-17 DIAGNOSIS — F900 Attention-deficit hyperactivity disorder, predominantly inattentive type: Secondary | ICD-10-CM | POA: Diagnosis not present

## 2023-08-17 DIAGNOSIS — F4312 Post-traumatic stress disorder, chronic: Secondary | ICD-10-CM | POA: Diagnosis not present

## 2023-08-17 DIAGNOSIS — F331 Major depressive disorder, recurrent, moderate: Secondary | ICD-10-CM | POA: Diagnosis not present

## 2023-08-28 DIAGNOSIS — F4310 Post-traumatic stress disorder, unspecified: Secondary | ICD-10-CM | POA: Diagnosis not present

## 2023-09-06 DIAGNOSIS — H5213 Myopia, bilateral: Secondary | ICD-10-CM | POA: Diagnosis not present

## 2023-09-10 DIAGNOSIS — F331 Major depressive disorder, recurrent, moderate: Secondary | ICD-10-CM | POA: Diagnosis not present

## 2023-09-10 DIAGNOSIS — F4312 Post-traumatic stress disorder, chronic: Secondary | ICD-10-CM | POA: Diagnosis not present

## 2023-09-10 DIAGNOSIS — F900 Attention-deficit hyperactivity disorder, predominantly inattentive type: Secondary | ICD-10-CM | POA: Diagnosis not present

## 2023-09-18 DIAGNOSIS — F4310 Post-traumatic stress disorder, unspecified: Secondary | ICD-10-CM | POA: Diagnosis not present

## 2023-09-24 ENCOUNTER — Encounter: Payer: Self-pay | Admitting: Advanced Practice Midwife

## 2023-09-24 ENCOUNTER — Ambulatory Visit: Admitting: Advanced Practice Midwife

## 2023-09-24 VITALS — BP 139/87 | HR 88 | Ht 62.0 in | Wt 164.0 lb

## 2023-09-24 DIAGNOSIS — R1032 Left lower quadrant pain: Secondary | ICD-10-CM | POA: Diagnosis not present

## 2023-09-24 DIAGNOSIS — N946 Dysmenorrhea, unspecified: Secondary | ICD-10-CM

## 2023-09-24 MED ORDER — ETONOGESTREL-ETHINYL ESTRADIOL 0.12-0.015 MG/24HR VA RING: VAGINAL_RING | VAGINAL | 12 refills | Status: DC

## 2023-09-24 NOTE — Patient Instructions (Signed)
[facility]:        [employer] [phone]                 [facility] [phone] (usually not accepting new patients unless you have family there already, you are always welcome to call and ask)       [facility] Department [phone]    [facility]:  [phone]  [facility]:  [phone]     [facility] Doctors:   [facility]: [phone]  [facility]: [phone]  [facility] Doctors:   [facility]: [phone]   [facility]: [phone]  [facility] Doctors:  [facility]: [phone]

## 2023-09-24 NOTE — Progress Notes (Signed)
 [facility] Visit  Patient name: Jasmine Vega MRN [id number]  Date of birth: [birth year]  CC & HPI:  Jasmine Vega is a 23 y.o. Caucasian female presenting today for painful periods.  Tried NR for a few months, but felt like it made her cramp even w/o periods.  Bleeding was fine, but cramps were too troublesome. Since then, periods are irregular (sometimes they start a few weeks late), but hasn't really skipped any. Has a hx of ovarian cyst, feels like she may have one again, has had same type of pain.  Wants to try NR again  Pertinent History Reviewed:  Medical & Surgical Hx:   Past Medical History:  Diagnosis Date   ADHD    Constipation    Depression    Frequent headaches    Ovarian cyst    Past Surgical History:  Procedure Laterality Date   TONSILECTOMY, ADENOIDECTOMY, BILATERAL MYRINGOTOMY AND TUBES     TONSILLECTOMY AND ADENOIDECTOMY     WISDOM TOOTH EXTRACTION     Family History  Problem Relation Age of Onset   Cancer Maternal Grandmother    Hernia Maternal Grandmother    Parkinson's disease Maternal Grandfather    Heart disease Father    Bipolar disorder Mother    Heart disease Brother    Asperger's syndrome Brother     Current Outpatient Medications:    buPROPion (WELLBUTRIN) 75 MG tablet, Take 75 mg by mouth daily., Disp: , Rfl:    etonogestrel-ethinyl estradiol (NUVARING) 0.12-0.015 MG/24HR vaginal ring, insert vaginally on the first of the month and remove on the 25 th., Disp: 1 each, Rfl: 12   FLUoxetine (PROZAC) 40 MG capsule, Take 40 mg by mouth daily., Disp: , Rfl:    hydrOXYzine (VISTARIL) 25 MG capsule, SMARTSIG:1 Capsule(s) By Mouth Every Evening, Disp: , Rfl:    methylphenidate (RITALIN) 5 MG tablet, Take 5 mg by mouth daily., Disp: , Rfl:    traZODone (DESYREL) 50 MG tablet, Take 75 mg by mouth at bedtime., Disp: , Rfl:  Social History: Reviewed -  reports that she has never smoked. She has never used smokeless tobacco.  Review of  Systems:   Constitutional: Negative for fever and chills Eyes: Negative for visual disturbances Respiratory: Negative for shortness of breath, dyspnea Cardiovascular: Negative for chest pain or palpitations  Gastrointestinal: Negative for vomiting, diarrhea and constipation; no abdominal pain Genitourinary: Negative for dysuria and urgency, vaginal irritation or itching Musculoskeletal: Negative for back pain, joint pain, myalgias  Neurological: Negative for dizziness and headaches    Objective Findings:    Physical Examination: Vitals:   09/24/23 1438  BP: 139/87  Pulse: 88   General appearance - well appearing, and in no distress Mental status - alert, oriented to person, place, and time Chest:  Normal respiratory effort Heart - normal rate and regular rhythm Abdomen:  Soft, nontender Pelvic: deferred Musculoskeletal:  Normal range of motion without pain Extremities:  No edema    No results found for this or any previous visit (from the past 24 hours).    Assessment & Plan:  A:   Dysmenorrhea w/irregular periods  ? Ovarian cyst P:  Start NR on 4/1, remove 4/25.    If cramps,  consider lysteda or COCs (higher dose est than in [place], as had too much BTB) Orders Placed This Encounter  Procedures   US PELVIS (TRANSABDOMINAL ONLY)    Standing Status:   Future    Expiration Date:   11/24/2023  Reason for exam::   LLQ pain, ? cyst    Preferred imaging location?:   Internal      Return for pelvic US /provider visit after. Scarlette Calico Cresenzo-Dishmon CNM 09/24/2023 3:46 PM

## 2023-10-02 ENCOUNTER — Other Ambulatory Visit

## 2023-10-02 ENCOUNTER — Ambulatory Visit: Admitting: Obstetrics and Gynecology

## 2023-10-06 ENCOUNTER — Encounter: Payer: Self-pay | Admitting: Adult Health

## 2023-10-06 ENCOUNTER — Ambulatory Visit: Admitting: Adult Health

## 2023-10-06 ENCOUNTER — Ambulatory Visit

## 2023-10-06 ENCOUNTER — Other Ambulatory Visit: Payer: Self-pay | Admitting: Advanced Practice Midwife

## 2023-10-06 VITALS — BP 134/89 | HR 121 | Ht 61.0 in | Wt 163.0 lb

## 2023-10-06 DIAGNOSIS — R1032 Left lower quadrant pain: Secondary | ICD-10-CM | POA: Diagnosis not present

## 2023-10-06 DIAGNOSIS — N83201 Unspecified ovarian cyst, right side: Secondary | ICD-10-CM | POA: Diagnosis not present

## 2023-10-06 DIAGNOSIS — N946 Dysmenorrhea, unspecified: Secondary | ICD-10-CM

## 2023-10-06 NOTE — Progress Notes (Signed)
  Subjective:     Patient ID: Jasmine Vega, female   DOB: 01/26/2001, 23 y.o.   MRN: [id number]  HPI Jasmine Vega is a 23 year old white female, with SO, G0P0, in for [place] for LLQ pain, and dysmenorrhea, she started nuva ring 09/29/23 and has felt better.    Component Value Date/Time   DIAGPAP  10/23/2022 1358    - Negative for intraepithelial lesion or malignancy (NILM)   ADEQPAP  10/23/2022 1358    Satisfactory for evaluation; transformation zone component ABSENT.   PCP is [facility]   Review of Systems Pain LLQ is better as are cramps Has trouble having BM, not constipated goes every day Reviewed past medical,surgical, social and family history. Reviewed medications and allergies.     Objective:   Physical Exam BP 134/89 (BP Location: Right Arm, Patient Position: Sitting, Cuff Size: Normal)   Pulse (!) 121   Ht 5\' 1"  (1.549 m)   Wt 163 lb (73.9 kg)   LMP 09/14/2023   BMI 30.80 kg/m     Skin warm and dry.  Lungs: clear to ausculation bilaterally. Cardiovascular: regular rate and rhythm.  [place] reviewed  with Jasmine Vega, uterus was normal, left ovary normal, hemorrhagic right ovarian cyst 3.1 cm x 3 x 2.6 cm.  Fall risk is low  Upstream - 10/06/23 1553       Pregnancy Intention Screening   Does the patient want to become pregnant in the next year? No    Does the patient's partner want to become pregnant in the next year? No    Would the patient like to discuss contraceptive options today? No      Contraception Wrap Up   Current Method Vaginal Ring    End Method Vaginal Ring    Contraception Counseling Provided Yes             Assessment:     1. Hemorrhagic cyst of right ovary (Primary) Has hemorrhagic cyst on right, continue nuva ring Will recheck [place] in 3 months for resolution - US PELVIC COMPLETE WITH TRANSVAGINAL; Future  2. Left lower quadrant pain LLQ pain better with nuva ring  - US PELVIC COMPLETE WITH TRANSVAGINAL; Future     Plan:    Try white grape juice or prune  juice to help with BM  Return in 3 months for pelvic US in office

## 2023-10-06 NOTE — Progress Notes (Signed)
 PELVIC US TA/TV: homogeneous anteverted uterus,WNL,EEC 8.6 mm,normal left ovary,hemorrhagic right ovarian cyst with lace- like echoes 3 x 2.6 x 3.1 cm,small amount of simple cul de sac fluid,pt had pelvic discomfort during ultrasound  Chaperone Kara Mead

## 2023-11-03 DIAGNOSIS — F331 Major depressive disorder, recurrent, moderate: Secondary | ICD-10-CM | POA: Diagnosis not present

## 2023-11-03 DIAGNOSIS — F900 Attention-deficit hyperactivity disorder, predominantly inattentive type: Secondary | ICD-10-CM | POA: Diagnosis not present

## 2023-11-03 DIAGNOSIS — F4312 Post-traumatic stress disorder, chronic: Secondary | ICD-10-CM | POA: Diagnosis not present

## 2023-11-06 DIAGNOSIS — F4310 Post-traumatic stress disorder, unspecified: Secondary | ICD-10-CM | POA: Diagnosis not present

## 2023-11-17 DIAGNOSIS — F419 Anxiety disorder, unspecified: Secondary | ICD-10-CM | POA: Diagnosis not present

## 2023-11-17 DIAGNOSIS — Z6830 Body mass index (BMI) 30.0-30.9, adult: Secondary | ICD-10-CM | POA: Diagnosis not present

## 2023-11-17 DIAGNOSIS — I10 Essential (primary) hypertension: Secondary | ICD-10-CM | POA: Diagnosis not present

## 2023-11-17 DIAGNOSIS — F32A Depression, unspecified: Secondary | ICD-10-CM | POA: Diagnosis not present

## 2023-12-04 DIAGNOSIS — F4310 Post-traumatic stress disorder, unspecified: Secondary | ICD-10-CM | POA: Diagnosis not present

## 2023-12-10 DIAGNOSIS — Z13220 Encounter for screening for lipoid disorders: Secondary | ICD-10-CM | POA: Diagnosis not present

## 2023-12-10 DIAGNOSIS — Z1329 Encounter for screening for other suspected endocrine disorder: Secondary | ICD-10-CM | POA: Diagnosis not present

## 2023-12-10 DIAGNOSIS — D559 Anemia due to enzyme disorder, unspecified: Secondary | ICD-10-CM | POA: Diagnosis not present

## 2023-12-10 DIAGNOSIS — I10 Essential (primary) hypertension: Secondary | ICD-10-CM | POA: Diagnosis not present

## 2023-12-10 DIAGNOSIS — Z131 Encounter for screening for diabetes mellitus: Secondary | ICD-10-CM | POA: Diagnosis not present

## 2023-12-16 DIAGNOSIS — F32A Depression, unspecified: Secondary | ICD-10-CM | POA: Diagnosis not present

## 2023-12-16 DIAGNOSIS — Z1331 Encounter for screening for depression: Secondary | ICD-10-CM | POA: Diagnosis not present

## 2023-12-16 DIAGNOSIS — I10 Essential (primary) hypertension: Secondary | ICD-10-CM | POA: Diagnosis not present

## 2023-12-16 DIAGNOSIS — Z0000 Encounter for general adult medical examination without abnormal findings: Secondary | ICD-10-CM | POA: Diagnosis not present

## 2023-12-16 DIAGNOSIS — Z6831 Body mass index (BMI) 31.0-31.9, adult: Secondary | ICD-10-CM | POA: Diagnosis not present

## 2023-12-16 DIAGNOSIS — F419 Anxiety disorder, unspecified: Secondary | ICD-10-CM | POA: Diagnosis not present

## 2023-12-16 DIAGNOSIS — E785 Hyperlipidemia, unspecified: Secondary | ICD-10-CM | POA: Diagnosis not present

## 2023-12-18 DIAGNOSIS — F4310 Post-traumatic stress disorder, unspecified: Secondary | ICD-10-CM | POA: Diagnosis not present

## 2023-12-29 DIAGNOSIS — F900 Attention-deficit hyperactivity disorder, predominantly inattentive type: Secondary | ICD-10-CM | POA: Diagnosis not present

## 2023-12-29 DIAGNOSIS — F331 Major depressive disorder, recurrent, moderate: Secondary | ICD-10-CM | POA: Diagnosis not present

## 2023-12-29 DIAGNOSIS — F4312 Post-traumatic stress disorder, chronic: Secondary | ICD-10-CM | POA: Diagnosis not present

## 2024-01-05 ENCOUNTER — Ambulatory Visit: Admitting: Adult Health

## 2024-01-05 ENCOUNTER — Other Ambulatory Visit

## 2024-01-07 ENCOUNTER — Other Ambulatory Visit

## 2024-01-15 DIAGNOSIS — F4310 Post-traumatic stress disorder, unspecified: Secondary | ICD-10-CM | POA: Diagnosis not present

## 2024-01-29 ENCOUNTER — Encounter: Payer: Self-pay | Admitting: Adult Health

## 2024-01-29 ENCOUNTER — Ambulatory Visit

## 2024-01-29 ENCOUNTER — Ambulatory Visit: Admitting: Adult Health

## 2024-01-29 VITALS — BP 133/86 | HR 76 | Ht 61.0 in | Wt 167.5 lb

## 2024-01-29 DIAGNOSIS — R1032 Left lower quadrant pain: Secondary | ICD-10-CM | POA: Diagnosis not present

## 2024-01-29 DIAGNOSIS — N83201 Unspecified ovarian cyst, right side: Secondary | ICD-10-CM | POA: Diagnosis not present

## 2024-01-29 DIAGNOSIS — R030 Elevated blood-pressure reading, without diagnosis of hypertension: Secondary | ICD-10-CM | POA: Diagnosis not present

## 2024-01-29 DIAGNOSIS — Z30011 Encounter for initial prescription of contraceptive pills: Secondary | ICD-10-CM

## 2024-01-29 MED ORDER — LO LOESTRIN FE 1 MG-10 MCG / 10 MCG PO TABS: 1.0000 | ORAL_TABLET | Freq: Every day | ORAL | 3 refills | Status: AC

## 2024-01-29 NOTE — Progress Notes (Signed)
  Subjective:     Patient ID: Jasmine Vega, female   DOB: Jan 16, 2001, 23 y.o.   MRN: [id number]  HPI Jasmine Vega is a 23 year old white female, with SO, G0P0, in for pelvic US  to assess right ovarian cyst and LLQ pain. She stopped nuva ring for about a month due to pain with sitting in vagina and is better. Still has some discomfort LLQ at times. She wants to discuss birth control.      Component Value Date/Time   DIAGPAP  10/23/2022 1358    - Negative for intraepithelial lesion or malignancy (NILM)   ADEQPAP  10/23/2022 1358    Satisfactory for evaluation; transformation zone component ABSENT.   PCP is [facility]  Review of Systems Has some headaches at times, no auras LLQ pain at times still Reviewed past medical,surgical, social and family history. Reviewed medications and allergies.     Objective:   Physical Exam BP 133/86 (BP Location: Left Arm, Patient Position: Sitting, Cuff Size: Normal)   Pulse 76   Ht 5' 1 (1.549 m)   Wt 167 lb 8 oz (76 kg)   LMP 01/05/2024 (Exact Date)   BMI 31.65 kg/m     Skin warm and dry.Lungs: clear to ausculation bilaterally. Cardiovascular: regular rate and rhythm. Reviewed US  was normal, cyst has resolved, normal uterus and ovaries and EEC is 3.5 mm.  Upstream - 01/29/24 1027       Pregnancy Intention Screening   Does the patient want to become pregnant in the next year? No    Does the patient's partner want to become pregnant in the next year? No    Would the patient like to discuss contraceptive options today? Yes      Contraception Wrap Up   Current Method Female Condom    End Method Oral Contraceptive;Female Condom    Contraception Counseling Provided Yes    How was the end contraceptive method provided? Prescription          Assessment:     1. Hemorrhagic cyst of right ovary resolved  2. Left lower quadrant pain Has on and off  3. Encounter for initial prescription of contraceptive pills (Primary) Will try lo Loestrin, can start  with next period, use condoms for one pack Rx sent Meds ordered this encounter  Medications   Norethindrone-Ethinyl Estradiol -Fe Biphas (LO LOESTRIN FE) 1 MG-10 MCG / 10 MCG tablet    Sig: Take 1 tablet by mouth daily. Take 1 daily by mouth    Dispense:  84 tablet    Refill:  3    BIN [id number], PCN [id number], GRP [id number],PI [id number]    Supervising Provider:   JAYNE MINDER H [[id number]]   Follow up in 3 months for ROS   4. Elevated BP without diagnosis of hypertension Will recheck in 3 months     Plan:     Return in 3 months for ROS and BP check

## 2024-01-29 NOTE — Progress Notes (Signed)
 PELVIC US  TA/TV: homogeneous anteverted uterus,WNL,normal ovaries,ovaries appear mobile,no free fluid,no pain during ultrasound,EEC 3.5 mm  Chaperone Peggy

## 2024-02-01 ENCOUNTER — Ambulatory Visit: Payer: Self-pay | Admitting: Adult Health

## 2024-02-23 DIAGNOSIS — F331 Major depressive disorder, recurrent, moderate: Secondary | ICD-10-CM | POA: Diagnosis not present

## 2024-02-23 DIAGNOSIS — F900 Attention-deficit hyperactivity disorder, predominantly inattentive type: Secondary | ICD-10-CM | POA: Diagnosis not present

## 2024-02-23 DIAGNOSIS — F4312 Post-traumatic stress disorder, chronic: Secondary | ICD-10-CM | POA: Diagnosis not present

## 2024-03-17 DIAGNOSIS — Z0000 Encounter for general adult medical examination without abnormal findings: Secondary | ICD-10-CM | POA: Diagnosis not present

## 2024-03-17 DIAGNOSIS — E785 Hyperlipidemia, unspecified: Secondary | ICD-10-CM | POA: Diagnosis not present

## 2024-04-18 DIAGNOSIS — F331 Major depressive disorder, recurrent, moderate: Secondary | ICD-10-CM | POA: Diagnosis not present

## 2024-04-18 DIAGNOSIS — F4312 Post-traumatic stress disorder, chronic: Secondary | ICD-10-CM | POA: Diagnosis not present

## 2024-04-18 DIAGNOSIS — F900 Attention-deficit hyperactivity disorder, predominantly inattentive type: Secondary | ICD-10-CM | POA: Diagnosis not present

## 2024-05-02 ENCOUNTER — Encounter: Payer: Self-pay | Admitting: Adult Health

## 2024-05-02 ENCOUNTER — Ambulatory Visit: Admitting: Adult Health

## 2024-05-02 VITALS — BP 131/87 | HR 70 | Ht 61.0 in | Wt 172.0 lb

## 2024-05-02 DIAGNOSIS — N926 Irregular menstruation, unspecified: Secondary | ICD-10-CM

## 2024-05-02 DIAGNOSIS — F439 Reaction to severe stress, unspecified: Secondary | ICD-10-CM

## 2024-05-02 DIAGNOSIS — Z3202 Encounter for pregnancy test, result negative: Secondary | ICD-10-CM | POA: Diagnosis not present

## 2024-05-02 DIAGNOSIS — Z131 Encounter for screening for diabetes mellitus: Secondary | ICD-10-CM | POA: Diagnosis not present

## 2024-05-02 DIAGNOSIS — R030 Elevated blood-pressure reading, without diagnosis of hypertension: Secondary | ICD-10-CM | POA: Diagnosis not present

## 2024-05-02 LAB — POCT URINE PREGNANCY: Preg Test, Ur: NEGATIVE

## 2024-05-02 MED ORDER — MEDROXYPROGESTERONE ACETATE 10 MG PO TABS: 10.0000 mg | ORAL_TABLET | Freq: Every day | ORAL | 0 refills | Status: AC

## 2024-05-02 NOTE — Progress Notes (Signed)
  Subjective:     Patient ID: Jasmine Vega, female   DOB: 11/21/2000, 23 y.o.   MRN: [id number]  HPI Jasmine Vega is a 23 year old white female, with SO. G0P0, back in follow up, she did not start a period, so has not started Lo Loestrin . Her Psychiatrist told her to ask about PCOs. She had normal US  in August. She is stressed at home.     Component Value Date/Time   DIAGPAP  10/23/2022 1358    - Negative for intraepithelial lesion or malignancy (NILM)   ADEQPAP  10/23/2022 1358    Satisfactory for evaluation; transformation zone component ABSENT.    PCP is Dr Trudy at [facility]  Review of Systems Periods are irregular, none since July +stress at home Reviewed past medical,surgical, social and family history. Reviewed medications and allergies.     Objective:   Physical Exam BP 131/87 (BP Location: Right Arm, Patient Position: Sitting, Cuff Size: Normal)   Pulse 70   Ht 5' 1 (1.549 m)   Wt 172 lb (78 kg)   LMP 01/05/2024   BMI 32.50 kg/m  UPT is negative    Skin warm and dry.  Lungs: clear to ausculation bilaterally. Cardiovascular: regular rate and rhythm.  Fall risk is low  Upstream - 05/02/24 0902       Pregnancy Intention Screening   Does the patient want to become pregnant in the next year? No    Does the patient's partner want to become pregnant in the next year? No    Would the patient like to discuss contraceptive options today? No      Contraception Wrap Up   Current Method Abstinence    End Method Abstinence          Assessment:     1. Negative pregnancy test  - POCT urine pregnancy  2. Irregular periods (Primary) Last period was in July  UPT is negative  Will rx provera 10 mg 1 daily for 10 days to see if can get withdrawal bleed Meds ordered this encounter  Medications   medroxyPROGESTERone (PROVERA) 10 MG tablet    Sig: Take 1 tablet (10 mg total) by mouth daily. Take for 10 days    Dispense:  10 tablet    Refill:  0    Supervising  Provider:   JAYNE VONN DEL [[id number]]   Will check random insulin, she is fasting today - Insulin, random She had labs with PCP, not long ago   3. Screening for diabetes mellitus - Hemoglobin A1c  4. Stress at home She sees Dr Delynn and is on Prozac,Wellbutrin and vistaril and ritalin   5. Elevated BP without diagnosis of hypertension She thinks it white cost syndrome     Plan:     Follow up in 3 weeks for ROS and to see if gets period, if bleeds will start lo Loestrin  then to cycle

## 2024-05-17 DIAGNOSIS — Z131 Encounter for screening for diabetes mellitus: Secondary | ICD-10-CM | POA: Diagnosis not present

## 2024-05-17 DIAGNOSIS — N926 Irregular menstruation, unspecified: Secondary | ICD-10-CM | POA: Diagnosis not present

## 2024-05-18 ENCOUNTER — Ambulatory Visit: Payer: Self-pay | Admitting: Adult Health

## 2024-05-18 LAB — HEMOGLOBIN A1C
Est. average glucose Bld gHb Est-mCnc: 105 mg/dL
Hgb A1c MFr Bld: 5.3 % (ref 4.8–5.6)

## 2024-05-18 LAB — INSULIN, RANDOM: INSULIN: 26 u[IU]/mL — ABNORMAL HIGH (ref 2.6–24.9)

## 2024-05-23 ENCOUNTER — Ambulatory Visit: Admitting: Adult Health

## 2024-05-31 ENCOUNTER — Ambulatory Visit: Admitting: Adult Health
